# Patient Record
Sex: Male | Born: 1940 | Race: White | Hispanic: No | Marital: Married | Smoking: Former smoker
Health system: Southern US, Community
[De-identification: ages and names within clinical notes are randomized; demographics above are authoritative.]

## PROBLEM LIST (undated history)

## (undated) DIAGNOSIS — R251 Tremor, unspecified: Secondary | ICD-10-CM

## (undated) DIAGNOSIS — E7800 Pure hypercholesterolemia, unspecified: Secondary | ICD-10-CM

## (undated) DIAGNOSIS — I10 Essential (primary) hypertension: Secondary | ICD-10-CM

## (undated) HISTORY — PX: CARDIAC VALVE REPLACEMENT: SHX585

## (undated) HISTORY — PX: DEEP BRAIN STIMULATOR PLACEMENT: SHX608

---

## 2015-08-02 ENCOUNTER — Encounter (HOSPITAL_BASED_OUTPATIENT_CLINIC_OR_DEPARTMENT_OTHER): Payer: Self-pay

## 2015-08-02 ENCOUNTER — Emergency Department (HOSPITAL_BASED_OUTPATIENT_CLINIC_OR_DEPARTMENT_OTHER): Payer: Medicare Other

## 2015-08-02 ENCOUNTER — Emergency Department (HOSPITAL_BASED_OUTPATIENT_CLINIC_OR_DEPARTMENT_OTHER)
Admission: EM | Admit: 2015-08-02 | Discharge: 2015-08-02 | Disposition: A | Discharge status: Home / Self Care | Payer: Medicare Other | Attending: Emergency Medicine | Admitting: Emergency Medicine

## 2015-08-02 DIAGNOSIS — S0990XA Unspecified injury of head, initial encounter: Secondary | ICD-10-CM | POA: Diagnosis not present

## 2015-08-02 DIAGNOSIS — S60511A Abrasion of right hand, initial encounter: Secondary | ICD-10-CM | POA: Diagnosis not present

## 2015-08-02 DIAGNOSIS — Y998 Other external cause status: Secondary | ICD-10-CM | POA: Insufficient documentation

## 2015-08-02 DIAGNOSIS — I10 Essential (primary) hypertension: Secondary | ICD-10-CM | POA: Insufficient documentation

## 2015-08-02 DIAGNOSIS — S30810A Abrasion of lower back and pelvis, initial encounter: Secondary | ICD-10-CM | POA: Insufficient documentation

## 2015-08-02 DIAGNOSIS — Y9389 Activity, other specified: Secondary | ICD-10-CM | POA: Diagnosis not present

## 2015-08-02 DIAGNOSIS — S80211A Abrasion, right knee, initial encounter: Secondary | ICD-10-CM | POA: Diagnosis not present

## 2015-08-02 DIAGNOSIS — T07XXXA Unspecified multiple injuries, initial encounter: Secondary | ICD-10-CM

## 2015-08-02 DIAGNOSIS — Z87891 Personal history of nicotine dependence: Secondary | ICD-10-CM | POA: Diagnosis not present

## 2015-08-02 DIAGNOSIS — S60512A Abrasion of left hand, initial encounter: Secondary | ICD-10-CM | POA: Diagnosis not present

## 2015-08-02 DIAGNOSIS — S50311A Abrasion of right elbow, initial encounter: Secondary | ICD-10-CM | POA: Insufficient documentation

## 2015-08-02 DIAGNOSIS — W1789XA Other fall from one level to another, initial encounter: Secondary | ICD-10-CM | POA: Insufficient documentation

## 2015-08-02 DIAGNOSIS — Y9289 Other specified places as the place of occurrence of the external cause: Secondary | ICD-10-CM | POA: Insufficient documentation

## 2015-08-02 DIAGNOSIS — S199XXA Unspecified injury of neck, initial encounter: Secondary | ICD-10-CM | POA: Insufficient documentation

## 2015-08-02 DIAGNOSIS — S80811A Abrasion, right lower leg, initial encounter: Secondary | ICD-10-CM | POA: Insufficient documentation

## 2015-08-02 DIAGNOSIS — S51801A Unspecified open wound of right forearm, initial encounter: Secondary | ICD-10-CM | POA: Diagnosis not present

## 2015-08-02 DIAGNOSIS — Z7982 Long term (current) use of aspirin: Secondary | ICD-10-CM | POA: Insufficient documentation

## 2015-08-02 DIAGNOSIS — S59911A Unspecified injury of right forearm, initial encounter: Secondary | ICD-10-CM | POA: Diagnosis present

## 2015-08-02 DIAGNOSIS — E7800 Pure hypercholesterolemia, unspecified: Secondary | ICD-10-CM | POA: Diagnosis not present

## 2015-08-02 DIAGNOSIS — W19XXXA Unspecified fall, initial encounter: Secondary | ICD-10-CM

## 2015-08-02 HISTORY — DX: Tremor, unspecified: R25.1

## 2015-08-02 HISTORY — DX: Pure hypercholesterolemia, unspecified: E78.00

## 2015-08-02 HISTORY — DX: Essential (primary) hypertension: I10

## 2015-08-02 NOTE — Discharge Instructions (Signed)
Abrasions °An abrasion is a cut or scrape of the skin. Abrasions do not go through all layers of the skin. °HOME CARE °· If a bandage (dressing) was put on your wound, change it as told by your doctor. If the bandage sticks, soak it off with warm. °· Wash the area with water and soap 2 times a day. Rinse off the soap. Pat the area dry with a clean towel. °· Put on medicated cream (ointment) as told by your doctor. °· Change your bandage right away if it gets wet or dirty. °· Only take medicine as told by your doctor. °· See your doctor within 24-48 hours to get your wound checked. °· Check your wound for redness, puffiness (swelling), or yellowish-white fluid (pus). °GET HELP RIGHT AWAY IF:  °· You have more pain in the wound. °· You have redness, swelling, or tenderness around the wound. °· You have pus coming from the wound. °· You have a fever or lasting symptoms for more than 2-3 days. °· You have a fever and your symptoms suddenly get worse. °· You have a bad smell coming from the wound or bandage. °MAKE SURE YOU:  °· Understand these instructions. °· Will watch your condition. °· Will get help right away if you are not doing well or get worse. °Document Released: 04/03/2008 Document Revised: 07/10/2012 Document Reviewed: 09/19/2011 °ExitCare® Patient Information ©2015 [employer]. This information is not intended to replace advice given to you by your health care provider. Make sure you discuss any questions you have with your health care provider. ° °Fall Prevention and Home Safety °Falls cause injuries and can affect all age groups. It is possible to use preventive measures to significantly decrease the likelihood of falls. There are many simple measures which can make your home safer and prevent falls. °OUTDOORS °· Repair cracks and edges of walkways and driveways. °· Remove high doorway thresholds. °· Trim shrubbery on the main path into your home. °· Have good outside lighting. °· Clear walkways of  tools, rocks, debris, and clutter. °· Check that handrails are not broken and are securely fastened. Both sides of steps should have handrails. °· Have leaves, snow, and ice cleared regularly. °· Use sand or salt on walkways during winter months. °· In the garage, clean up grease or oil spills. °BATHROOM °· Install night lights. °· Install grab bars by the toilet and in the tub and shower. °· Use non-skid mats or decals in the tub or shower. °· Place a plastic non-slip stool in the shower to sit on, if needed. °· Keep floors dry and clean up all water on the floor immediately. °· Remove soap buildup in the tub or shower on a regular basis. °· Secure bath mats with non-slip, double-sided rug tape. °· Remove throw rugs and tripping hazards from the floors. °BEDROOMS °· Install night lights. °· Make sure a bedside light is easy to reach. °· Do not use oversized bedding. °· Keep a telephone by your bedside. °· Have a firm chair with side arms to use for getting dressed. °· Remove throw rugs and tripping hazards from the floor. °KITCHEN °· Keep handles on pots and pans turned toward the center of the stove. Use back burners when possible. °· Clean up spills quickly and allow time for drying. °· Avoid walking on wet floors. °· Avoid hot utensils and knives. °· Position shelves so they are not too high or low. °· Place commonly used objects within easy reach. °· If   necessary, use a sturdy step stool with a grab bar when reaching. °· Keep electrical cables out of the way. °· Do not use floor polish or wax that makes floors slippery. If you must use wax, use non-skid floor wax. °· Remove throw rugs and tripping hazards from the floor. °STAIRWAYS °· Never leave objects on stairs. °· Place handrails on both sides of stairways and use them. Fix any loose handrails. Make sure handrails on both sides of the stairways are as long as the stairs. °· Check carpeting to make sure it is firmly attached along stairs. Make repairs to  worn or loose carpet promptly. °· Avoid placing throw rugs at the top or bottom of stairways, or properly secure the rug with carpet tape to prevent slippage. Get rid of throw rugs, if possible. °· Have an electrician put in a light switch at the top and bottom of the stairs. °OTHER FALL PREVENTION TIPS °· Wear low-heel or rubber-soled shoes that are supportive and fit well. Wear closed toe shoes. °· When using a stepladder, make sure it is fully opened and both spreaders are firmly locked. Do not climb a closed stepladder. °· Add color or contrast paint or tape to grab bars and handrails in your home. Place contrasting color strips on first and last steps. °· Learn and use mobility aids as needed. Install an electrical emergency response system. °· Turn on lights to avoid dark areas. Replace light bulbs that burn out immediately. Get light switches that glow. °· Arrange furniture to create clear pathways. Keep furniture in the same place. °· Firmly attach carpet with non-skid or double-sided tape. °· Eliminate uneven floor surfaces. °· Select a carpet pattern that does not visually hide the edge of steps. °· Be aware of all pets. °OTHER HOME SAFETY TIPS °· Set the water temperature for 120° F (48.8° C). °· Keep emergency numbers on or near the telephone. °· Keep smoke detectors on every level of the home and near sleeping areas. °Document Released: 10/06/2002 Document Revised: 04/16/2012 Document Reviewed: 01/05/2012 °ExitCare® Patient Information ©2015 [employer]. This information is not intended to replace advice given to you by your health care provider. Make sure you discuss any questions you have with your health care provider. ° °

## 2015-08-02 NOTE — ED Notes (Signed)
Patient transported to CT 

## 2015-08-02 NOTE — ED Provider Notes (Signed)
CSN: [id number]     Arrival date & time 08/02/15  1719 History  By signing my name below, I, Soijett Blue, attest that this documentation has been prepared under the direction and in the presence of Gwyneth Sprout, MD. Electronically Signed: Soijett Blue, ED Scribe. 08/02/2015. 5:46 PM.   Chief Complaint  Patient presents with  . Fall      The history is provided by the patient. No language interpreter was used.    Scott Marsh is a 74 y.o. male who presents to the Emergency Department complaining of fall onset 1 hour ago PTA. He notes that he fell when he lost balance and fell off the porch. He reports that he was doing some work on his porch when he stood up, lost his balance, fell down 2 steps, hit his head on the concrete. He reports that he has a deep brain stimulator that has been in for a couple months and he has concern for it. He thinks that he has last had a tetanus within the last 5 years. Pt is having associated symptoms of right knee/left hand/right hand/right lower back/right forearm/right elbow abrasions, hitting his head, and mild neck pain. He denies LOC, HA, neck pain, dizziness, lightheadedness, rib pain, SOB, and any other symptoms. He states that his INR was last check 2 weeks ago and was 2.5. He takes currently warfarin.   Past Medical History  Diagnosis Date  . Tremors of nervous system   . Hypertension   . High cholesterol    Past Surgical History  Procedure Laterality Date  . Deep brain stimulator placement    . Cardiac valve replacement     No family history on file. Social History  Substance Use Topics  . Smoking status: Former Games developer  . Smokeless tobacco: None  . Alcohol Use: Yes     Comment: occ    Review of Systems  Respiratory: Negative for shortness of breath.   Musculoskeletal: Positive for neck pain (mild). Negative for back pain and arthralgias.  Skin: Positive for wound (multiple abrasions to right hand/right forearm/right knee/back).   Neurological: Negative for dizziness, syncope, light-headedness and headaches.      Allergies  Review of patient's allergies indicates no known allergies.  Home Medications   Prior to Admission medications   Medication Sig Start Date End Date Taking? Authorizing Provider  AMIODARONE HCL PO Take by mouth.   Yes Historical Provider, MD  aspirin 81 MG tablet Take 81 mg by mouth daily.   Yes Historical Provider, MD  LOVASTATIN PO Take by mouth.   Yes Historical Provider, MD  PRIMIDONE PO Take by mouth.   Yes Historical Provider, MD  PROPRANOLOL HCL ER PO Take by mouth.   Yes Historical Provider, MD  Sertraline HCl (ZOLOFT PO) Take by mouth.   Yes Historical Provider, MD  Warfarin Sodium (COUMADIN PO) Take by mouth.   Yes Historical Provider, MD   BP 142/65 mmHg  Pulse 53  Temp(Src) 97.9 F (36.6 C) (Oral)  Resp 18  Ht  (1.803 m)  Wt 268 lb (121.564 kg)  BMI 37.39 kg/m2  SpO2 98% Physical Exam  Constitutional: He is oriented to person, place, and time. He appears well-developed and well-nourished. No distress.  HENT:  Head: Normocephalic and atraumatic.  Eyes: EOM are normal.  Neck: Neck supple.  Cardiovascular: Normal rate, regular rhythm and normal heart sounds.  Exam reveals no gallop and no friction rub.   No murmur heard. Pulmonary/Chest: Effort normal and breath  sounds normal. No respiratory distress. He has no wheezes. He has no rales.  Abdominal: Soft. There is no tenderness.  Musculoskeletal: Normal range of motion.       Cervical back: Normal.       Thoracic back: Normal.       Lumbar back: Normal.       Back:       Arms:      Hands:      Legs: Neurological: He is alert and oriented to person, place, and time.  Skin: Skin is warm and dry.  Psychiatric: He has a normal mood and affect. His behavior is normal.  Nursing note and vitals reviewed.   ED Course  Procedures (including critical care time) DIAGNOSTIC STUDIES: Oxygen Saturation is 98% on RA,  nl by my interpretation.    COORDINATION OF CARE: 5:41 PM Discussed treatment plan with pt at bedside which includes CT head without contrast and CT C-spine without contrast and pt agreed to plan.    Labs Review Labs Reviewed - No data to display  Imaging Review Ct Head Wo Contrast  08/02/2015   CLINICAL DATA:  Fall  EXAM: CT HEAD WITHOUT CONTRAST  CT CERVICAL SPINE WITHOUT CONTRAST  TECHNIQUE: Multidetector CT imaging of the head and cervical spine was performed following the standard protocol without intravenous contrast. Multiplanar CT image reconstructions of the cervical spine were also generated.  COMPARISON:  None.  FINDINGS: CT HEAD FINDINGS  A stimulator device enters the cranium via the right frontal bone, traverses through the right frontal lobe, and has its tip in the right thalamus.  There is no mass effect, midline shift, or acute intracranial hemorrhage. Moderate chronic ischemic changes in the periventricular white matter. Ventricular system is unremarkable for age. Mild global atrophy appropriate to age is noted. Mastoid air cells are clear. There is a small amount of fluid in the left posterior ethmoid air cell.  CT CERVICAL SPINE FINDINGS  Motion artifact limits the examination. No obvious fracture or dislocation. No obvious spinal hematoma or soft tissue injury. There is multilevel facet arthropathy in the upper right cervical spine. Spinal stimulator wire courses through the subcutaneous fat of the right side of the neck and across the anterior right thorax.  IMPRESSION: No acute intracranial pathology. Postoperative and chronic changes are noted.  There is motion artifact on the cervical spine study but no obvious evidence of injury.   Electronically Signed   By: Jolaine Click M.D.   On: 08/02/2015 18:23   Ct Cervical Spine Wo Contrast  08/02/2015   CLINICAL DATA:  Fall  EXAM: CT HEAD WITHOUT CONTRAST  CT CERVICAL SPINE WITHOUT CONTRAST  TECHNIQUE: Multidetector CT imaging of the  head and cervical spine was performed following the standard protocol without intravenous contrast. Multiplanar CT image reconstructions of the cervical spine were also generated.  COMPARISON:  None.  FINDINGS: CT HEAD FINDINGS  A stimulator device enters the cranium via the right frontal bone, traverses through the right frontal lobe, and has its tip in the right thalamus.  There is no mass effect, midline shift, or acute intracranial hemorrhage. Moderate chronic ischemic changes in the periventricular white matter. Ventricular system is unremarkable for age. Mild global atrophy appropriate to age is noted. Mastoid air cells are clear. There is a small amount of fluid in the left posterior ethmoid air cell.  CT CERVICAL SPINE FINDINGS  Motion artifact limits the examination. No obvious fracture or dislocation. No obvious spinal hematoma or soft tissue  injury. There is multilevel facet arthropathy in the upper right cervical spine. Spinal stimulator wire courses through the subcutaneous fat of the right side of the neck and across the anterior right thorax.  IMPRESSION: No acute intracranial pathology. Postoperative and chronic changes are noted.  There is motion artifact on the cervical spine study but no obvious evidence of injury.   Electronically Signed   By: Jolaine Click M.D.   On: 08/02/2015 18:23   I have personally reviewed and evaluated these images and lab results as part of my medical decision-making.   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 He states he stood up and lost his balance and fell backwards. He can't is concerned that he may have hit his head on the pavement. He denies any headache and there is no external  trauma to the head however given patient is taking anticoagulation and has a stimulator will do a CT to ensure everything is within normal limits. He also complains of some mild neck stiffness but has full range of motion and no reproducible pain.  Superficial injury to the bilateral upper extremities and right knee consistent with skin tears and superficial abrasions. Mild abrasions to the back. Patient is able to ambulate without difficulty and flex and extend all joints without concern for broken bones.  Head and C-spine CT pending  6:38 PM Imaging neg and pt d/ced home.  I personally performed the services described in this documentation, which was scribed in my presence.  The recorded information has been reviewed and considered.    Gwyneth Sprout, MD 08/02/15 [phone]

## 2015-08-02 NOTE — ED Notes (Signed)
Lost balance and fell off porch approx 45 min PTA-denies LOC-pain right knee, right arm, left hand and back-denies neck pain states he may have hit head but denies pain to head

## 2015-08-28 ENCOUNTER — Emergency Department (HOSPITAL_BASED_OUTPATIENT_CLINIC_OR_DEPARTMENT_OTHER): Payer: Medicare Other

## 2015-08-28 ENCOUNTER — Encounter (HOSPITAL_BASED_OUTPATIENT_CLINIC_OR_DEPARTMENT_OTHER): Payer: Self-pay | Admitting: *Deleted

## 2015-08-28 ENCOUNTER — Emergency Department (HOSPITAL_BASED_OUTPATIENT_CLINIC_OR_DEPARTMENT_OTHER)
Admission: EM | Admit: 2015-08-28 | Discharge: 2015-08-28 | Disposition: A | Discharge status: Home / Self Care | Payer: Medicare Other | Attending: Emergency Medicine | Admitting: Emergency Medicine

## 2015-08-28 DIAGNOSIS — R52 Pain, unspecified: Secondary | ICD-10-CM

## 2015-08-28 DIAGNOSIS — Z79899 Other long term (current) drug therapy: Secondary | ICD-10-CM | POA: Diagnosis not present

## 2015-08-28 DIAGNOSIS — Y9289 Other specified places as the place of occurrence of the external cause: Secondary | ICD-10-CM | POA: Diagnosis not present

## 2015-08-28 DIAGNOSIS — W228XXA Striking against or struck by other objects, initial encounter: Secondary | ICD-10-CM | POA: Diagnosis not present

## 2015-08-28 DIAGNOSIS — E7800 Pure hypercholesterolemia, unspecified: Secondary | ICD-10-CM | POA: Insufficient documentation

## 2015-08-28 DIAGNOSIS — S8011XA Contusion of right lower leg, initial encounter: Secondary | ICD-10-CM | POA: Insufficient documentation

## 2015-08-28 DIAGNOSIS — Z7982 Long term (current) use of aspirin: Secondary | ICD-10-CM | POA: Insufficient documentation

## 2015-08-28 DIAGNOSIS — I10 Essential (primary) hypertension: Secondary | ICD-10-CM | POA: Diagnosis not present

## 2015-08-28 DIAGNOSIS — Y9389 Activity, other specified: Secondary | ICD-10-CM | POA: Diagnosis not present

## 2015-08-28 DIAGNOSIS — Z87891 Personal history of nicotine dependence: Secondary | ICD-10-CM | POA: Insufficient documentation

## 2015-08-28 DIAGNOSIS — Y998 Other external cause status: Secondary | ICD-10-CM | POA: Diagnosis not present

## 2015-08-28 DIAGNOSIS — S8991XA Unspecified injury of right lower leg, initial encounter: Secondary | ICD-10-CM | POA: Diagnosis present

## 2015-08-28 DIAGNOSIS — Z7901 Long term (current) use of anticoagulants: Secondary | ICD-10-CM | POA: Diagnosis not present

## 2015-08-28 LAB — CBC
HEMATOCRIT: 40.5 % (ref 39.0–52.0)
Hemoglobin: 13 g/dL (ref 13.0–17.0)
MCH: 30 pg (ref 26.0–34.0)
MCHC: 32.1 g/dL (ref 30.0–36.0)
MCV: 93.5 fL (ref 78.0–100.0)
PLATELETS: 162 10*3/uL (ref 150–400)
RBC: 4.33 MIL/uL (ref 4.22–5.81)
RDW: 13.7 % (ref 11.5–15.5)
WBC: 7 10*3/uL (ref 4.0–10.5)

## 2015-08-28 LAB — PROTIME-INR
INR: 2.87 — AB (ref 0.00–1.49)
Prothrombin Time: 29.6 seconds — ABNORMAL HIGH (ref 11.6–15.2)

## 2015-08-28 NOTE — Discharge Instructions (Signed)
Elevate your right leg and apply ice for 20 minutes at a time. If your swelling or pain worsens, you notice weakness or numbness/tingling or your foot changes color, return to the ER immediately. If you are still having significant swelling this week, follow up with the orthopedist above. Continue to take your coumadin as usual.

## 2015-08-28 NOTE — ED Notes (Signed)
Pt reports was changing a tire and rolled into his right shin on Monday evening- C/o pain and swelling- pt on coumadin

## 2015-08-28 NOTE — ED Provider Notes (Signed)
CSN: [id number]     Arrival date & time 08/28/15  1315 History   First MD Initiated Contact with Patient 08/28/15 1335     Chief Complaint  Patient presents with  . Leg Pain     (Consider location/radiation/quality/duration/timing/severity/associated sxs/prior Treatment) HPI  74 year old male presents with consistent right lower leg swelling since a spare tire accidentally fell and hit his leg 5 days ago. Patient states the pain is mild, 3/10. Worsens with any type of ambulation or movement. Is able to walk however. Is on Coumadin for a heart valve replacement. Patient denies any weakness or numbness. The swelling is not worsening but because it is still present he became concerned and wanted come in to the ER to rule out a blood clot. No lacerations.  Past Medical History  Diagnosis Date  . Tremors of nervous system   . Hypertension   . High cholesterol    Past Surgical History  Procedure Laterality Date  . Deep brain stimulator placement    . Cardiac valve replacement     No family history on file. Social History  Substance Use Topics  . Smoking status: Former Games developermoker  . Smokeless tobacco: Current User    Types: Snuff  . Alcohol Use: Yes     Comment: rare    Review of Systems  Respiratory: Negative for shortness of breath.   Cardiovascular: Positive for leg swelling. Negative for chest pain.  Musculoskeletal: Positive for myalgias and arthralgias.  Skin: Positive for color change (bruising). Negative for wound.  Neurological: Negative for weakness and numbness.  All other systems reviewed and are negative.     Allergies  Review of patient's allergies indicates no known allergies.  Home Medications   Prior to Admission medications   Medication Sig Start Date End Date Taking? Authorizing Provider  AMIODARONE HCL PO Take by mouth.   Yes Historical Provider, MD  aspirin 81 MG tablet Take 81 mg by mouth daily.   Yes Historical Provider, MD  b complex vitamins  tablet Take 1 tablet by mouth daily.   Yes Historical Provider, MD  Coenzyme Q10-Levocarnitine (CO Q-10 PLUS PO) Take by mouth.   Yes Historical Provider, MD  LOVASTATIN PO Take by mouth.   Yes Historical Provider, MD  Omega-3 Fatty Acids (FISH OIL PO) Take by mouth.   Yes Historical Provider, MD  PRIMIDONE PO Take by mouth.   Yes Historical Provider, MD  PROPRANOLOL HCL ER PO Take by mouth.   Yes Historical Provider, MD  Sertraline HCl (ZOLOFT PO) Take by mouth.   Yes Historical Provider, MD  Warfarin Sodium (COUMADIN PO) Take by mouth.   Yes Historical Provider, MD   BP 139/64 mmHg  Pulse 52  Temp(Src) 98.2 F (36.8 C) (Oral)  Resp 20  Ht 5\' 11"  (1.803 m)  Wt 270 lb (122.471 kg)  BMI 37.67 kg/m2  SpO2 93% Physical Exam  Constitutional: He is oriented to person, place, and time. He appears well-developed and well-nourished.  HENT:  Head: Normocephalic and atraumatic.  Right Ear: External ear normal.  Left Ear: External ear normal.  Nose: Nose normal.  Eyes: Right eye exhibits no discharge. Left eye exhibits no discharge.  Neck: Neck supple.  Cardiovascular: Normal rate, regular rhythm, normal heart sounds and intact distal pulses.   Pulses:      Dorsalis pedis pulses are 1+ on the right side, and 1+ on the left side.  Pulmonary/Chest: Effort normal and breath sounds normal.  Abdominal: Soft. He exhibits no  distension.  Musculoskeletal: He exhibits no edema.       Right knee: No tenderness found.       Right ankle: He exhibits swelling. No tenderness.       Right lower leg: He exhibits tenderness and swelling.       Legs: Normal strength and sensation in bilateral lower extremities  Neurological: He is alert and oriented to person, place, and time.  Skin: Skin is warm and dry.  Nursing note and vitals reviewed.   ED Course  Procedures (including critical care time) Labs Review Labs Reviewed  PROTIME-INR - Abnormal; Notable for the following:    Prothrombin Time 29.6  (*)    INR 2.87 (*)    All other components within normal limits  CBC    Imaging Review Dg Tibia/fibula Right  08/28/2015  CLINICAL DATA:  RIGHT lower extremity swelling after blunt trauma. EXAM: RIGHT TIBIA AND FIBULA - 2 VIEW COMPARISON:  None. FINDINGS: No fracture dislocation of the tibia fibula. Venous varicosities noted in the calf. Atherosclerotic calcification of the arterial vasculature. IMPRESSION: No acute osseous abnormality. Electronically Signed   By: Scott Marsh M.D.   On: 08/28/2015 14:04   US Venous Img Lower Unilateral Right  08/28/2015  CLINICAL DATA:  Blunt trauma RIGHT lower extremity. Pain and bruising. Varicose veins. EXAM: RIGHT LOWER EXTREMITY VENOUS DOPPLER ULTRASOUND TECHNIQUE: Gray-scale sonography with graded compression, as well as color Doppler and duplex ultrasound were performed to evaluate the lower extremity deep venous systems from the level of the common femoral vein and including the common femoral, femoral, profunda femoral, popliteal and calf veins including the posterior tibial, peroneal and gastrocnemius veins when visible. The superficial great saphenous vein was also interrogated. Spectral Doppler was utilized to evaluate flow at rest and with distal augmentation maneuvers in the common femoral, femoral and popliteal veins. COMPARISON:  None. FINDINGS: Contralateral Common Femoral Vein: Respiratory phasicity is normal and symmetric with the symptomatic side. No evidence of thrombus. Normal compressibility. Common Femoral Vein: No evidence of thrombus. Normal compressibility, respiratory phasicity and response to augmentation. Saphenofemoral Junction: No evidence of thrombus. Normal compressibility and flow on color Doppler imaging. Profunda Femoral Vein: No evidence of thrombus. Normal compressibility and flow on color Doppler imaging. Femoral Vein: No evidence of thrombus. Normal compressibility, respiratory phasicity and response to augmentation.  Popliteal Vein: No evidence of thrombus. Normal compressibility, respiratory phasicity and response to augmentation. Calf Veins: No evidence of thrombus. Normal compressibility and flow on color Doppler imaging. Superficial Great Saphenous Vein: No evidence of thrombus. Normal compressibility and flow on color Doppler imaging. Other Findings: There is a hypoechoic elongated lesion anterior margin of the RIGHT lower extremity at the level of the proximal tibia consistent with hematoma. The hematoma measures 4.7 x 1.0 by 1.0 cm No flow. IMPRESSION: No evidence of deep venous thrombosis. Elongated Hematoma within the subcutaneous tissue of the anterior LEFT lower extremity. Electronically Signed   By: Scott Marsh M.D.   On: 08/28/2015 14:34   I have personally reviewed and evaluated these images and lab results as part of my medical decision-making.   EKG Interpretation None      MDM   Final diagnoses:  Leg hematoma, right, initial encounter    Patient is neurovascular intact with is symmetrically swollen right lower extremity. Patient's ultrasound shows no DVT but does so a symmetrically elongated hematoma. Likely has an exaggerated hematoma due to his Coumadin. INR is therapeutic. No fractures. He is able to ambulate like  normal. Plan to rest, ice, and elevate. Will refer to orthopedics in case symptoms are not improving. Discussed strict return precautions. No signs of compartment syndrome at this time.    Pricilla Loveless, MD 08/28/15 [phone]

## 2016-01-12 IMAGING — US US EXTREM LOW VENOUS*R*
1 series · 13 of 24 positions shown · non-contrast
Comparison: None.

ADDENDUM:
Correction:  RIGHT  lower extremity subcutaneous hematoma.
CLINICAL DATA: Blunt trauma RIGHT lower extremity. Pain and
bruising. Varicose veins.



[Series 1: us extrem low venous*right* · 0.10mm/px · 45 acquisitions, 13 frames shown]
[im 1/45]
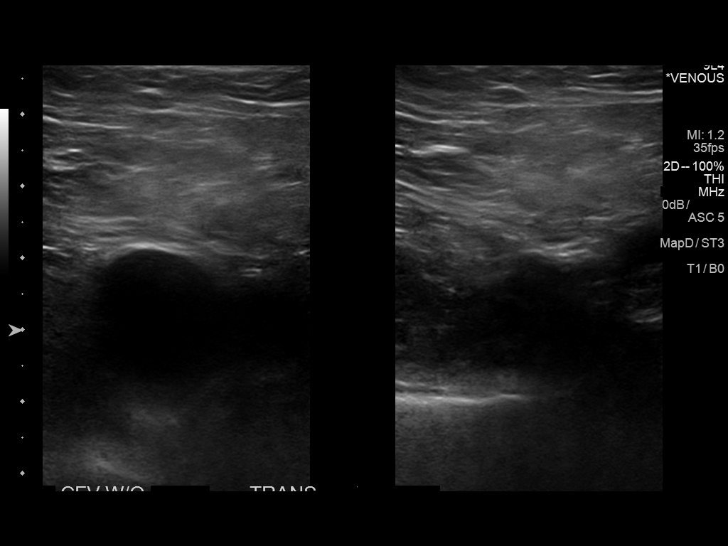
[im 4/45]
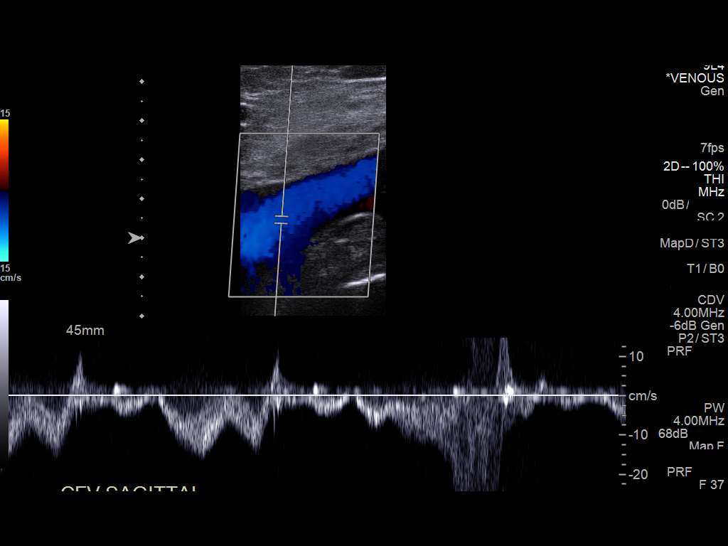
[im 8/45]
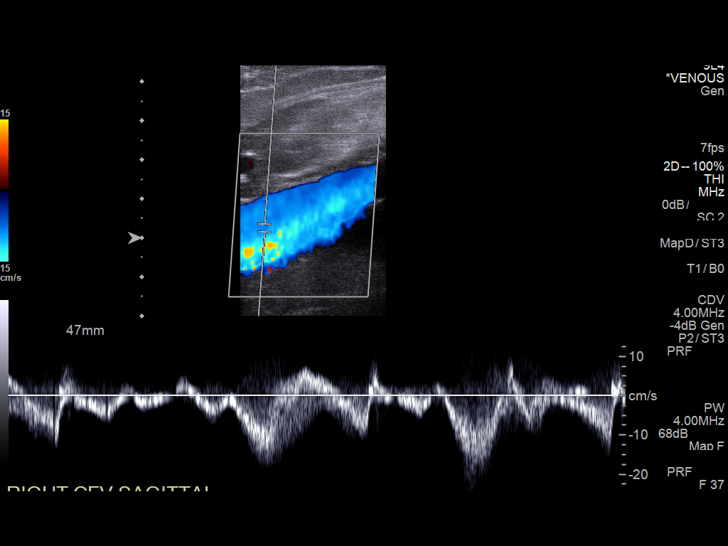
[im 12/45]
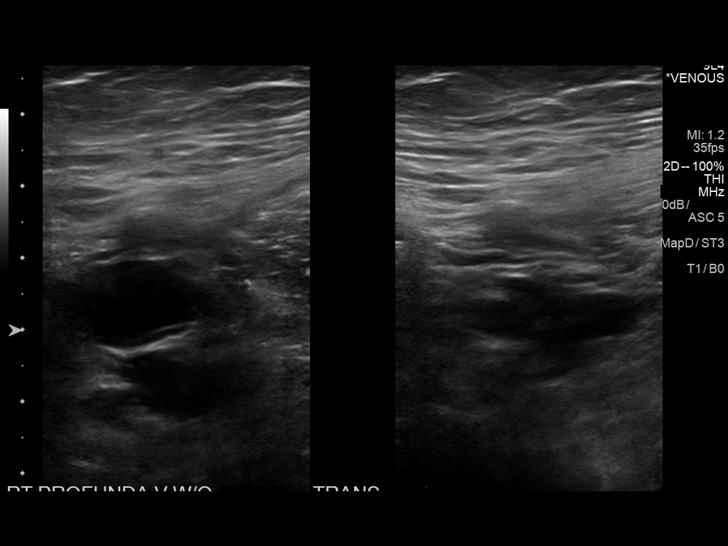
[im 16/45]
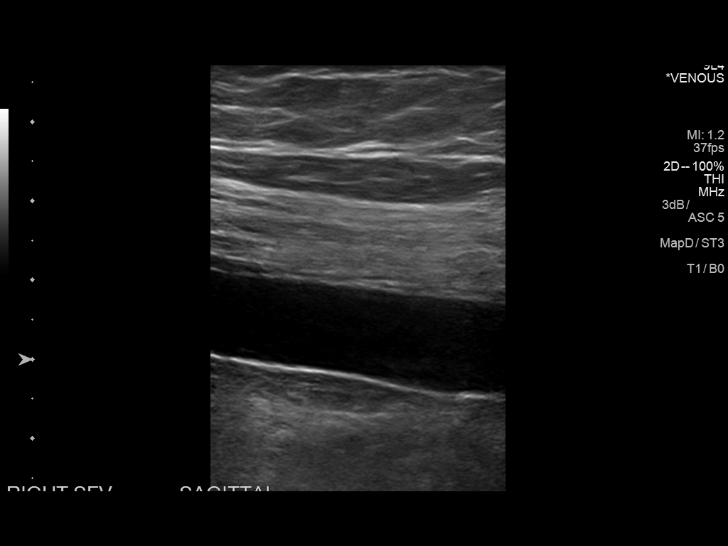
[im 20/45]
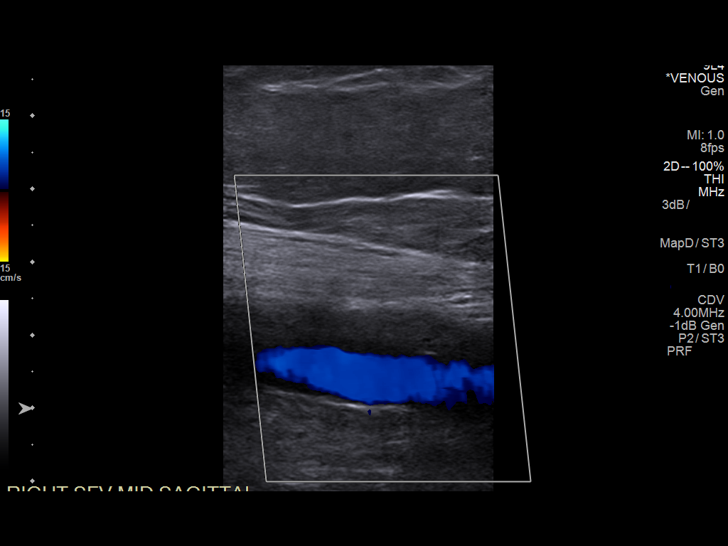
[im 25/45]
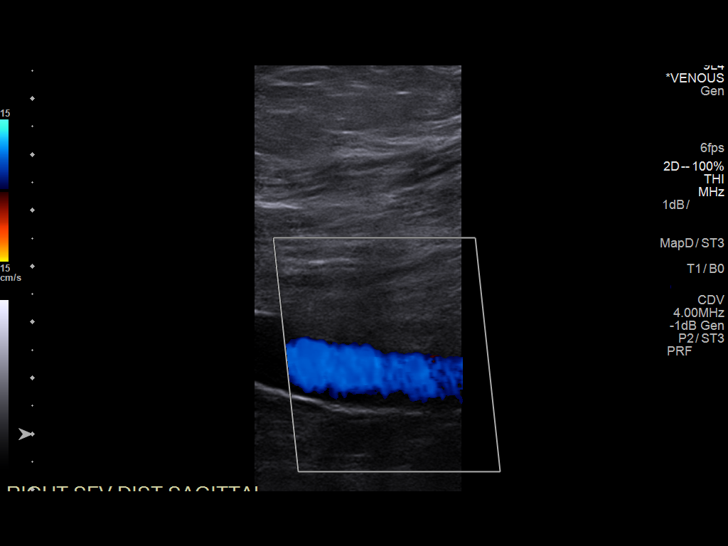
[im 27/45]
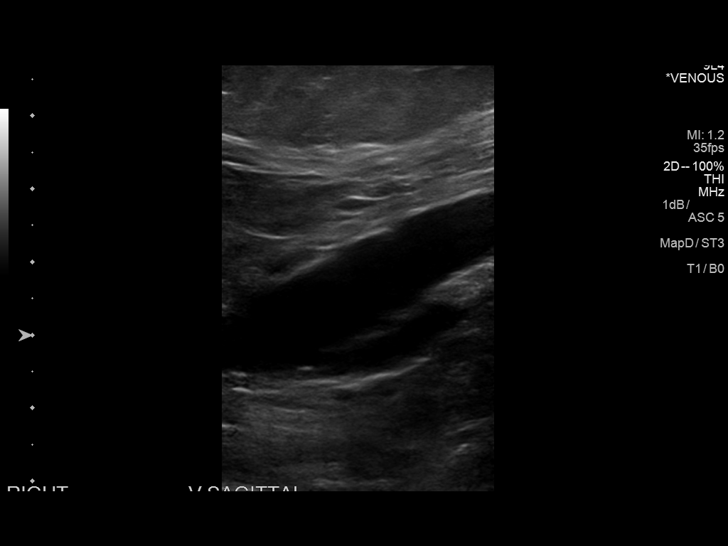
[im 31/45]
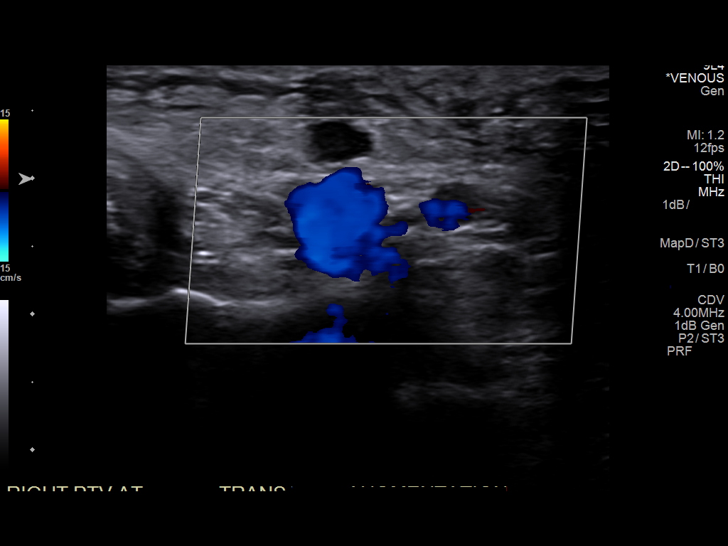
[im 35/45]
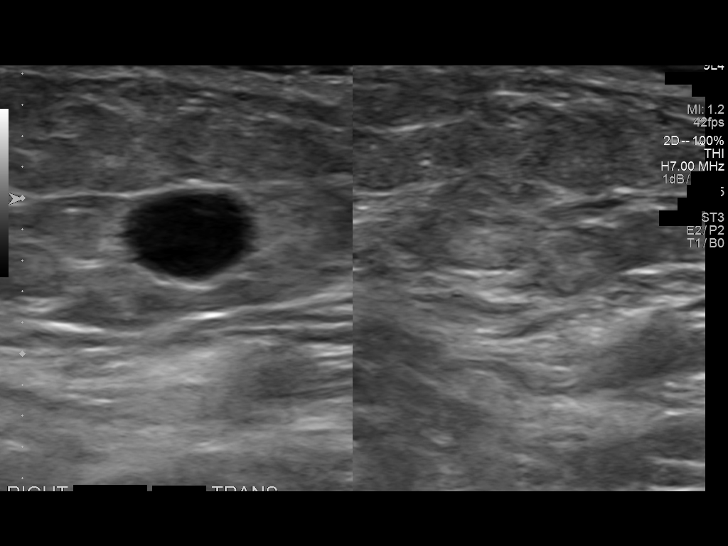
[im 39/45]
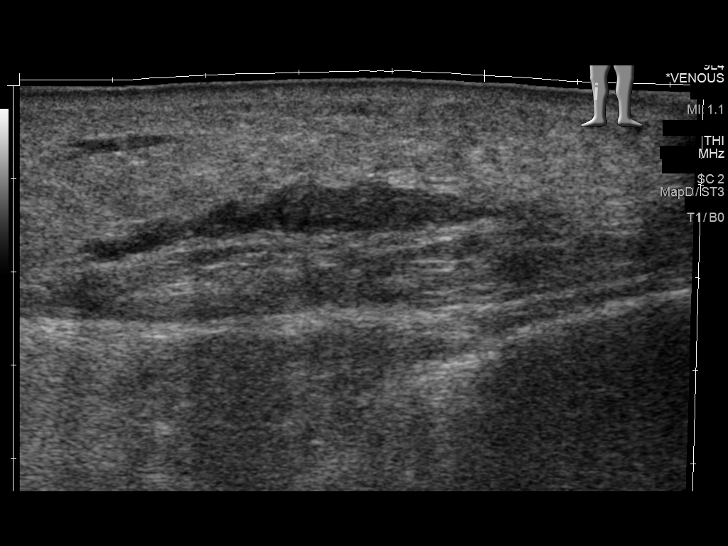
[im 43/45]
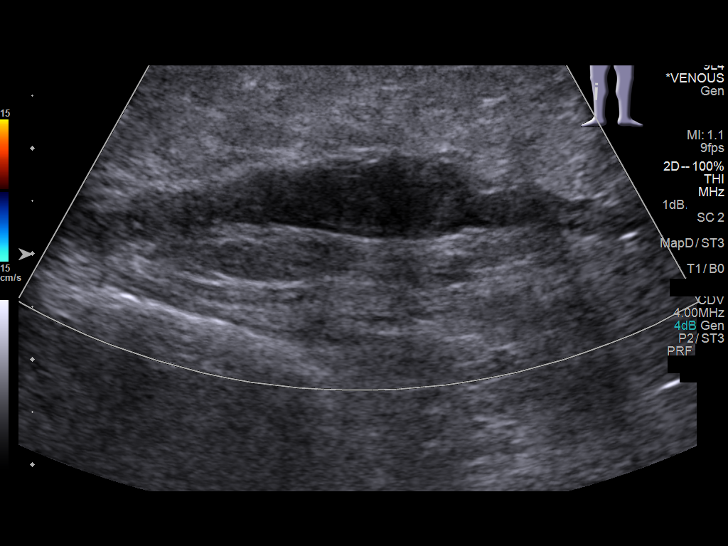
[im 45/45]
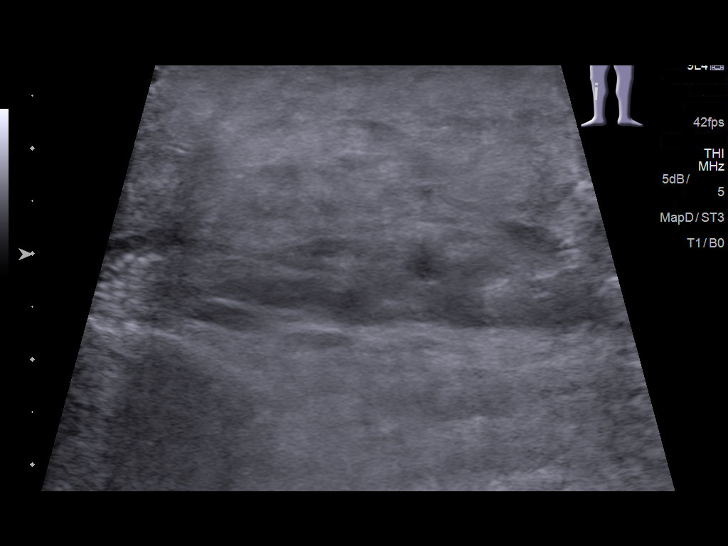

[13 of 24 positions shown; findings below may reference images not displayed]

FINDINGS: Contralateral Common Femoral Vein: Respiratory phasicity is normal
and symmetric with the symptomatic side. No evidence of thrombus.
Normal compressibility.

Common Femoral Vein: No evidence of thrombus. Normal
compressibility, respiratory phasicity and response to augmentation.

Saphenofemoral Junction: No evidence of thrombus. Normal
compressibility and flow on color Doppler imaging.

Profunda Femoral Vein: No evidence of thrombus. Normal
compressibility and flow on color Doppler imaging.

Femoral Vein: No evidence of thrombus. Normal compressibility,
respiratory phasicity and response to augmentation.

Popliteal Vein: No evidence of thrombus. Normal compressibility,
respiratory phasicity and response to augmentation.

Calf Veins: No evidence of thrombus. Normal compressibility and flow
on color Doppler imaging.

Superficial Great Saphenous Vein: No evidence of thrombus. Normal
compressibility and flow on color Doppler imaging.

Other Findings: There is a hypoechoic elongated lesion anterior
margin of the RIGHT lower extremity at the level of the proximal
tibia consistent with hematoma. The hematoma measures 4.7 x 1.0 by
1.0 cm No flow.
IMPRESSION: No evidence of deep venous thrombosis.

Elongated Hematoma within the subcutaneous tissue of the anterior
LEFT lower extremity.

## 2023-01-29 DEATH — deceased
